# Patient Record
Sex: Female | Born: 1935 | Race: Black or African American | Hispanic: No | Smoking: Never smoker
Health system: Southern US, Community
[De-identification: ages and names within clinical notes are randomized; demographics above are authoritative.]

## PROBLEM LIST (undated history)

## (undated) DIAGNOSIS — I10 Essential (primary) hypertension: Secondary | ICD-10-CM

## (undated) DIAGNOSIS — M1990 Unspecified osteoarthritis, unspecified site: Secondary | ICD-10-CM

## (undated) HISTORY — PX: CHOLECYSTECTOMY: SHX55

## (undated) HISTORY — PX: COLON SURGERY: SHX602

---

## 2009-09-13 ENCOUNTER — Emergency Department (HOSPITAL_BASED_OUTPATIENT_CLINIC_OR_DEPARTMENT_OTHER): Admission: EM | Admit: 2009-09-13 | Discharge: 2009-09-13 | Payer: Self-pay | Admitting: Emergency Medicine

## 2011-09-07 ENCOUNTER — Encounter: Payer: Self-pay | Admitting: Family Medicine

## 2011-09-07 ENCOUNTER — Emergency Department (HOSPITAL_BASED_OUTPATIENT_CLINIC_OR_DEPARTMENT_OTHER)
Admission: EM | Admit: 2011-09-07 | Discharge: 2011-09-07 | Disposition: A | Discharge status: Home / Self Care | Payer: Medicare Other | Attending: Emergency Medicine | Admitting: Emergency Medicine

## 2011-09-07 ENCOUNTER — Emergency Department (INDEPENDENT_AMBULATORY_CARE_PROVIDER_SITE_OTHER): Payer: Medicare Other

## 2011-09-07 DIAGNOSIS — M5137 Other intervertebral disc degeneration, lumbosacral region: Secondary | ICD-10-CM

## 2011-09-07 DIAGNOSIS — M79609 Pain in unspecified limb: Secondary | ICD-10-CM

## 2011-09-07 DIAGNOSIS — I10 Essential (primary) hypertension: Secondary | ICD-10-CM | POA: Insufficient documentation

## 2011-09-07 DIAGNOSIS — M549 Dorsalgia, unspecified: Secondary | ICD-10-CM

## 2011-09-07 DIAGNOSIS — E119 Type 2 diabetes mellitus without complications: Secondary | ICD-10-CM | POA: Insufficient documentation

## 2011-09-07 HISTORY — DX: Essential (primary) hypertension: I10

## 2011-09-07 LAB — URINE MICROSCOPIC-ADD ON

## 2011-09-07 LAB — URINALYSIS, ROUTINE W REFLEX MICROSCOPIC
Hgb urine dipstick: NEGATIVE
Ketones, ur: 15 mg/dL — AB
Nitrite: NEGATIVE
pH: 5 (ref 5.0–8.0)

## 2011-09-07 LAB — GLUCOSE, CAPILLARY: Glucose-Capillary: 126 mg/dL — ABNORMAL HIGH (ref 70–99)

## 2011-09-07 MED ORDER — OXYCODONE-ACETAMINOPHEN 5-325 MG PO TABS: 1.0000 | ORAL_TABLET | Freq: Three times a day (TID) | ORAL | Status: AC | PRN

## 2011-09-07 MED ORDER — OXYCODONE-ACETAMINOPHEN 5-325 MG PO TABS
1.0000 | ORAL_TABLET | Freq: Once | ORAL | Status: AC
  Administered 2011-09-07: 1 via ORAL
  Filled 2011-09-07: qty 1

## 2011-09-07 NOTE — ED Provider Notes (Signed)
History     CSN: [id number] Arrival date & time: 09/07/2011  2:47 PM Pt seen at 1500 Chief Complaint  Patient presents with  . Back Pain   Patient is a 76 y.o. female presenting with back pain. The history is provided by the patient.  Back Pain  This is a new problem. The current episode started more than 2 days ago. The problem occurs daily. The problem has not changed since onset.The pain is associated with no known injury. The pain is present in the lumbar spine. The quality of the pain is described as aching. The pain does not radiate. The pain is moderate. The symptoms are aggravated by certain positions and twisting. Pertinent negatives include no chest pain, no fever, no numbness, no abdominal pain, no bowel incontinence, no bladder incontinence, no dysuria, no leg pain, no paresthesias, no tingling and no weakness.   Denies trauma No fever No abd pain No h/o back surgery No abd pain Not on anticoagulants No cp reported  Past Medical History  Diagnosis Date  . Diabetes mellitus   . Hypertension     Past Surgical History  Procedure Date  . Cholecystectomy   . Colon surgery     No family history on file.  History  Substance Use Topics  . Smoking status: Never Smoker   . Smokeless tobacco: Not on file  . Alcohol Use: No    OB History    Grav Para Term Preterm Abortions TAB SAB Ect Mult Living                  Review of Systems  Constitutional: Negative for fever.  Cardiovascular: Negative for chest pain.  Gastrointestinal: Negative for abdominal pain and bowel incontinence.  Genitourinary: Negative for bladder incontinence and dysuria.  Musculoskeletal: Positive for back pain.  Neurological: Negative for tingling, weakness, numbness and paresthesias.  All other systems reviewed and are negative.    Physical Exam  BP 133/94  Pulse 105  Temp(Src) 98.4 F (36.9 C) (Oral)  Resp 20  Ht 5\' 4"  (1.626 m)  Wt 330 lb (149.687 kg)  BMI 56.64 kg/m2  SpO2  100%  Physical Exam  CONSTITUTIONAL: Well developed/well nourished HEAD AND FACE: Normocephalic/atraumatic EYES: EOMI/PERRL ENMT: Mucous membranes moist NECK: supple no meningeal signs SPINE:lumbar spine tender, lumbar paraspinal tenderness, No bruising/crepitance/stepoffs noted to spine CV: no murmurs/rubs/gallops noted LUNGS: Lungs are clear to auscultation bilaterally, no apparent distress ABDOMEN: soft, nontender, no rebound or guarding.  Obesity noted but no focal tenderness/abscess/erythema noted GU:no cva tenderness, no bruising noted NEURO: Pt is awake/alert, moves all extremitiesx4 She is able to ambulate equal distal motor: hip flexion/knee flexion/extension, ankle dorsi/plantar flexion, great toe extension intact bilaterally,no apparent sensory deficit in any dermatome.   EXTREMITIES: pulses normal/equal, full ROM SKIN: warm, color normal PSYCH: no abnormalities of mood noted   ED Course  Procedures  MDM Nursing notes reviewed and considered in documentation All labs/vitals reviewed and considered xrays reviewed and considered  3:19 PM Pt reports pain only with movement/palpation of back She has no neuro deficits She is well appearing She uses a walker all the time even before back pain started, and reports no change in ambulation   Pt well appearing, feels improved tolerated percocet No focal motor deficits She is ambulatory at baseline Urine culture sent I advised strict return precautions and need for close f/u with her PCP Dr orr Pt agreeable   Joya Gaskins, MD 09/07/11 1642

## 2011-09-07 NOTE — ED Notes (Signed)
Pt c/o bilateral low back pain x 1 wk. Pt reports h/o back pain but "worse today". Pt denies bowel or bladder incontinence. Pain is worse with movement.

## 2011-09-09 LAB — URINE CULTURE
Colony Count: 40000
Culture  Setup Time: 201209130615

## 2012-05-03 ENCOUNTER — Emergency Department (INDEPENDENT_AMBULATORY_CARE_PROVIDER_SITE_OTHER): Payer: Medicare Other

## 2012-05-03 ENCOUNTER — Emergency Department (HOSPITAL_BASED_OUTPATIENT_CLINIC_OR_DEPARTMENT_OTHER)
Admission: EM | Admit: 2012-05-03 | Discharge: 2012-05-03 | Disposition: A | Discharge status: Home / Self Care | Payer: Medicare Other | Attending: Emergency Medicine | Admitting: Emergency Medicine

## 2012-05-03 ENCOUNTER — Encounter (HOSPITAL_BASED_OUTPATIENT_CLINIC_OR_DEPARTMENT_OTHER): Payer: Self-pay | Admitting: *Deleted

## 2012-05-03 DIAGNOSIS — M25559 Pain in unspecified hip: Secondary | ICD-10-CM | POA: Insufficient documentation

## 2012-05-03 DIAGNOSIS — I10 Essential (primary) hypertension: Secondary | ICD-10-CM | POA: Insufficient documentation

## 2012-05-03 DIAGNOSIS — E119 Type 2 diabetes mellitus without complications: Secondary | ICD-10-CM | POA: Insufficient documentation

## 2012-05-03 DIAGNOSIS — M129 Arthropathy, unspecified: Secondary | ICD-10-CM | POA: Insufficient documentation

## 2012-05-03 DIAGNOSIS — Z794 Long term (current) use of insulin: Secondary | ICD-10-CM | POA: Insufficient documentation

## 2012-05-03 DIAGNOSIS — M25551 Pain in right hip: Secondary | ICD-10-CM

## 2012-05-03 HISTORY — DX: Unspecified osteoarthritis, unspecified site: M19.90

## 2012-05-03 MED ORDER — TRAMADOL HCL 50 MG PO TABS: 50.0000 mg | ORAL_TABLET | Freq: Four times a day (QID) | ORAL | Status: AC | PRN

## 2012-05-03 MED ORDER — TRAMADOL HCL 50 MG PO TABS
50.0000 mg | ORAL_TABLET | Freq: Once | ORAL | Status: AC
  Administered 2012-05-03: 50 mg via ORAL
  Filled 2012-05-03: qty 1

## 2012-05-03 NOTE — ED Provider Notes (Signed)
History     CSN: [id number]  Arrival date & time 05/03/12  1145   First MD Initiated Contact with Patient 05/03/12 1152      Chief Complaint  Patient presents with  . Hip Pain    (Consider location/radiation/quality/duration/timing/severity/associated sxs/prior treatment) HPI Patient is a 77 year old female with history of osteoarthritis who complains today of right hip pain. She rates this as an 8/10. This is worse with walking and radiates to her right knee and back. Patient denies any pain in the knee itself. She has no history of trauma. Patient has noted this pain for the past 2 days. It is an aching sensation. She has never had anything like it. Patient has previously taken Arthrotec for her symptoms. She notes that her prescription is not yet ready for this. Patient denies any other symptoms today. There are no other associated or modifying factors. Past Medical History  Diagnosis Date  . Diabetes mellitus   . Hypertension   . Arthritis     Past Surgical History  Procedure Date  . Cholecystectomy   . Colon surgery     History reviewed. No pertinent family history.  History  Substance Use Topics  . Smoking status: Never Smoker   . Smokeless tobacco: Not on file  . Alcohol Use: No    OB History    Grav Para Term Preterm Abortions TAB SAB Ect Mult Living                  Review of Systems  Constitutional: Negative.   HENT: Negative.   Eyes: Negative.   Respiratory: Negative.   Cardiovascular: Negative.   Gastrointestinal: Negative.   Genitourinary: Negative.   Musculoskeletal:       See HPI  Skin: Negative.   Neurological: Negative.   Hematological: Negative.   Psychiatric/Behavioral: Negative.   All other systems reviewed and are negative.    Allergies  Ciprofloxacin and Vicodin  Home Medications   Current Outpatient Rx  Name Route Sig Dispense Refill  . ALBUTEROL SULFATE 0.63 MG/3ML IN NEBU Nebulization Take 1 ampule by nebulization every 6  (six) hours as needed.      . ARFORMOTEROL TARTRATE 15 MCG/2ML IN NEBU Nebulization Take 15 mcg by nebulization 2 (two) times daily.      . ASPIRIN 325 MG PO TBEC Oral Take 325 mg by mouth daily.      . ATORVASTATIN CALCIUM 40 MG PO TABS Oral Take 40 mg by mouth daily.      . BUMETANIDE 0.5 MG PO TABS Oral Take 0.5 mg by mouth daily.      Marland Kitchen DICLOFENAC-MISOPROSTOL 75-200 MG-MCG PO TABS Oral Take 1 tablet by mouth daily.      Marland Kitchen GABAPENTIN 300 MG PO CAPS Oral Take 300 mg by mouth 3 (three) times daily.      . INSULIN ASPART PROT & ASPART (70-30) 100 UNIT/ML [place] SUSP Subcutaneous Inject into the skin.      . INSULIN PEN NEEDLE 30G X 8 MM MISC Subcutaneous Inject 1 packet into the skin as needed.      Marland Kitchen METFORMIN HCL 500 MG PO TABS Oral Take 500 mg by mouth daily after supper.      Marland Kitchen NITROGLYCERIN 0.4 MG SL SUBL Sublingual Place 0.4 mg under the tongue every 5 (five) minutes as needed.      Marland Kitchen NORTRIPTYLINE HCL 25 MG PO CAPS Oral Take 25 mg by mouth at bedtime.      . OMEPRAZOLE 20 MG  PO CPDR Oral Take 20 mg by mouth daily.      . TRAMADOL HCL 50 MG PO TABS Oral Take 1 tablet (50 mg total) by mouth every 6 (six) hours as needed for pain. 30 tablet 0  . VALSARTAN-HYDROCHLOROTHIAZIDE 160-25 MG PO TABS Oral Take 1 tablet by mouth daily.        BP 153/76  Pulse 77  Temp(Src) 98.2 F (36.8 C) (Oral)  Resp 20  SpO2 100%  Physical Exam  Nursing note and vitals reviewed. GEN: Well-developed, morbidly obese female in no distress HEENT: Atraumatic, normocephalic.  EYES: PERRLA BL, no scleral icterus. NECK: Trachea midline, no meningismus CV: regular rate and rhythm.  PULM: No respiratory distress.  Breathing easily  GI: soft, non-tender. No guarding, rebound, or tenderness. + bowel sounds  GU: deferred Neuro: cranial nerves 2-12 intact, no abnormalities of strength or sensation, A and O x 3 MSK: Patient moves all 4 extremities symmetrically, no deformity, edema noted. Patient has no tenderness to  palpation in the right hip. She describes the pain as an internal aching. This is worse with standing and bearing weight on the hip. Patient's body habitus severely limits evaluation. She is very obese and has limited range of motion just based on body habitus at baseline. She has been ambulate with her walker. Skin: No rashes petechiae, purpura, or jaundice Psych: no abnormality of mood   ED Course  Procedures (including critical care time)  Labs Reviewed - No data to display Dg Hip Complete Right  05/03/2012  *RADIOLOGY REPORT*  Clinical Data: Right hip pain.  RIGHT HIP - COMPLETE 2+ VIEW  Comparison: None  Findings: Mild degenerative changes in the hips bilaterally. No acute bony abnormality.  Specifically, no fracture, subluxation, or dislocation.  Soft tissues are intact.  SI joints are symmetric and unremarkable.  IMPRESSION: Mild degenerative changes. No acute bony abnormality.  Original Report Authenticated By: Cyndie Chime, M.D.     1. Hip pain, acute, right       MDM  Patient presented with complaint of right hip pain. She had continued to ambulate with this. She did have plain film performed to confirm no occult cause of pain such as a sclerotic lesion or evidence of other process within the body. This was negative. Patient had some improvement in her pain was tramadol here. She was given a prescription for this until her Arthrotec can be refilled. Patient was discharged in good condition and was comfortable with plan for discharge.        Cyndra Numbers, MD 05/03/12 1339

## 2012-05-03 NOTE — ED Notes (Signed)
Right hip pain for a week. Got worse yesterday. No relief with rubbing it. States she has a hx of arthritis in her knees and back.

## 2014-07-20 ENCOUNTER — Encounter (HOSPITAL_BASED_OUTPATIENT_CLINIC_OR_DEPARTMENT_OTHER): Payer: Self-pay | Admitting: Emergency Medicine

## 2014-07-20 ENCOUNTER — Emergency Department (HOSPITAL_BASED_OUTPATIENT_CLINIC_OR_DEPARTMENT_OTHER)
Admission: EM | Admit: 2014-07-20 | Discharge: 2014-07-20 | Disposition: A | Discharge status: Home / Self Care | Payer: Medicare Other | Attending: Emergency Medicine | Admitting: Emergency Medicine

## 2014-07-20 ENCOUNTER — Emergency Department (HOSPITAL_BASED_OUTPATIENT_CLINIC_OR_DEPARTMENT_OTHER): Payer: Medicare Other

## 2014-07-20 DIAGNOSIS — M1710 Unilateral primary osteoarthritis, unspecified knee: Secondary | ICD-10-CM | POA: Diagnosis not present

## 2014-07-20 DIAGNOSIS — Y9389 Activity, other specified: Secondary | ICD-10-CM | POA: Diagnosis not present

## 2014-07-20 DIAGNOSIS — IMO0002 Reserved for concepts with insufficient information to code with codable children: Secondary | ICD-10-CM | POA: Diagnosis not present

## 2014-07-20 DIAGNOSIS — S4980XA Other specified injuries of shoulder and upper arm, unspecified arm, initial encounter: Secondary | ICD-10-CM | POA: Insufficient documentation

## 2014-07-20 DIAGNOSIS — E119 Type 2 diabetes mellitus without complications: Secondary | ICD-10-CM | POA: Insufficient documentation

## 2014-07-20 DIAGNOSIS — Z791 Long term (current) use of non-steroidal anti-inflammatories (NSAID): Secondary | ICD-10-CM | POA: Diagnosis not present

## 2014-07-20 DIAGNOSIS — S46909A Unspecified injury of unspecified muscle, fascia and tendon at shoulder and upper arm level, unspecified arm, initial encounter: Secondary | ICD-10-CM | POA: Insufficient documentation

## 2014-07-20 DIAGNOSIS — I10 Essential (primary) hypertension: Secondary | ICD-10-CM | POA: Diagnosis not present

## 2014-07-20 DIAGNOSIS — S59909A Unspecified injury of unspecified elbow, initial encounter: Secondary | ICD-10-CM | POA: Insufficient documentation

## 2014-07-20 DIAGNOSIS — Z79899 Other long term (current) drug therapy: Secondary | ICD-10-CM | POA: Insufficient documentation

## 2014-07-20 DIAGNOSIS — Y9289 Other specified places as the place of occurrence of the external cause: Secondary | ICD-10-CM | POA: Insufficient documentation

## 2014-07-20 DIAGNOSIS — W19XXXA Unspecified fall, initial encounter: Secondary | ICD-10-CM

## 2014-07-20 DIAGNOSIS — S99919A Unspecified injury of unspecified ankle, initial encounter: Secondary | ICD-10-CM

## 2014-07-20 DIAGNOSIS — S99929A Unspecified injury of unspecified foot, initial encounter: Secondary | ICD-10-CM | POA: Diagnosis not present

## 2014-07-20 DIAGNOSIS — W010XXA Fall on same level from slipping, tripping and stumbling without subsequent striking against object, initial encounter: Secondary | ICD-10-CM | POA: Insufficient documentation

## 2014-07-20 DIAGNOSIS — M1712 Unilateral primary osteoarthritis, left knee: Secondary | ICD-10-CM

## 2014-07-20 DIAGNOSIS — S59919A Unspecified injury of unspecified forearm, initial encounter: Secondary | ICD-10-CM

## 2014-07-20 DIAGNOSIS — S6990XA Unspecified injury of unspecified wrist, hand and finger(s), initial encounter: Secondary | ICD-10-CM

## 2014-07-20 DIAGNOSIS — S8990XA Unspecified injury of unspecified lower leg, initial encounter: Secondary | ICD-10-CM | POA: Insufficient documentation

## 2014-07-20 DIAGNOSIS — Z7982 Long term (current) use of aspirin: Secondary | ICD-10-CM | POA: Insufficient documentation

## 2014-07-20 MED ORDER — IBUPROFEN 200 MG PO TABS: 200.0000 mg | ORAL_TABLET | Freq: Four times a day (QID) | ORAL | Status: AC | PRN

## 2014-07-20 NOTE — ED Notes (Signed)
Pt sts she was moving from her walker to her chair and slipped and fell. Pt c/o left arm and leg pain.

## 2014-07-20 NOTE — ED Notes (Signed)
Return from X-ray. Pending for results. Pt reports no LOC. C/o pain on left shoulder, left knee and left elbow.

## 2014-07-20 NOTE — ED Provider Notes (Signed)
CSN: [id number]     Arrival date & time 07/20/14  1235 History   First MD Initiated Contact with Patient 07/20/14 1257     Chief Complaint  Patient presents with  . Fall   HPI The patient presents to the emergency room for evaluation of left arm and left knee injuries after a fall. The patient uses a walker. She was moving from her walker to a chair when her left leg slipped and she fell to the ground. Patient fell onto her left side. She did not have any loss of consciousness. She has not had any trouble with weakness or dizziness. Patient now has some mild to moderate pain and discomfort in her left shoulder, left elbow and left knee.   Past Medical History  Diagnosis Date  . Diabetes mellitus   . Hypertension   . Arthritis    Past Surgical History  Procedure Laterality Date  . Cholecystectomy    . Colon surgery     No family history on file. History  Substance Use Topics  . Smoking status: Never Smoker   . Smokeless tobacco: Not on file  . Alcohol Use: No   OB History   Grav Para Term Preterm Abortions TAB SAB Ect Mult Living                 Review of Systems  All other systems reviewed and are negative.     Allergies  Ciprofloxacin and Vicodin  Home Medications   Prior to Admission medications   Medication Sig Start Date End Date Taking? Authorizing Provider  simvastatin (ZOCOR) 40 MG tablet Take 40 mg by mouth daily.   Yes Historical Provider, MD  albuterol (ACCUNEB) 0.63 MG/3ML nebulizer solution Take 1 ampule by nebulization every 6 (six) hours as needed.      Historical Provider, MD  arformoterol (BROVANA) 15 MCG/2ML NEBU Take 15 mcg by nebulization 2 (two) times daily.      Historical Provider, MD  aspirin 325 MG EC tablet Take 325 mg by mouth daily.      Historical Provider, MD  atorvastatin (LIPITOR) 40 MG tablet Take 40 mg by mouth daily.      Historical Provider, MD  bumetanide (BUMEX) 0.5 MG tablet Take 0.5 mg by mouth daily.      Historical Provider,  MD  diclofenac-misoprostol (ARTHROTEC 75) 75-0.2 MG per tablet Take 1 tablet by mouth daily.      Historical Provider, MD  gabapentin (NEURONTIN) 300 MG capsule Take 300 mg by mouth 3 (three) times daily.      Historical Provider, MD  insulin aspart protamine-insulin aspart (NOVOLOG 70/30) (70-30) 100 UNIT/ML injection Inject into the skin.      Historical Provider, MD  Insulin Pen Needle (NOVOFINE) 30G X 8 MM MISC Inject 1 packet into the skin as needed.      Historical Provider, MD  metFORMIN (GLUCOPHAGE) 500 MG tablet Take 500 mg by mouth daily after supper.      Historical Provider, MD  nitroGLYCERIN (NITROSTAT) 0.4 MG SL tablet Place 0.4 mg under the tongue every 5 (five) minutes as needed.      Historical Provider, MD  nortriptyline (PAMELOR) 25 MG capsule Take 25 mg by mouth at bedtime.      Historical Provider, MD  omeprazole (PRILOSEC) 20 MG capsule Take 20 mg by mouth daily.      Historical Provider, MD  valsartan-hydrochlorothiazide (DIOVAN-HCT) 160-25 MG per tablet Take 1 tablet by mouth daily.  Historical Provider, MD   BP 190/85  Pulse 110  Temp(Src) 98.2 F (36.8 C) (Oral)  Resp 20  Ht 5\' 4"  (1.626 m)  Wt 330 lb (149.687 kg)  BMI 56.62 kg/m2  SpO2 98% Physical Exam  Nursing note and vitals reviewed. Constitutional: No distress.  Morbidly obese  HENT:  Head: Normocephalic and atraumatic.  Right Ear: External ear normal.  Left Ear: External ear normal.  Eyes: Conjunctivae are normal. Right eye exhibits no discharge. Left eye exhibits no discharge. No scleral icterus.  Neck: Neck supple. No tracheal deviation present.  Cardiovascular: Normal rate, regular rhythm and intact distal pulses.   Pulmonary/Chest: Effort normal and breath sounds normal. No stridor. No respiratory distress. She has no wheezes. She has no rales.  Abdominal: Soft. Bowel sounds are normal. She exhibits no distension. There is no tenderness. There is no rebound and no guarding.  Musculoskeletal:  She exhibits no edema.       Left shoulder: She exhibits tenderness and bony tenderness. She exhibits normal range of motion, no swelling, no effusion and no deformity.       Left elbow: She exhibits normal range of motion, no swelling, no effusion and no deformity. Tenderness found.       Left hip: Normal. She exhibits normal range of motion, normal strength and no tenderness.       Left knee: She exhibits normal range of motion, no swelling and no effusion. Tenderness found.       Cervical back: Normal.       Thoracic back: Normal.       Lumbar back: Normal.  Mild tenderness palpation left shoulder and left olecranon process, superficial abrasion noted over the left olecranon  Neurological: She is alert. She has normal strength. No cranial nerve deficit (no facial droop, extraocular movements intact, no slurred speech) or sensory deficit. She exhibits normal muscle tone. She displays no seizure activity. Coordination normal.  Skin: Skin is warm and dry. No rash noted.  Psychiatric: She has a normal mood and affect.    ED Course  Procedures (including critical care time) Labs Review Labs Reviewed - No data to display  Imaging Review Dg Elbow Complete Left  07/20/2014   CLINICAL DATA:  Left elbow injury and pain  EXAM: LEFT ELBOW - COMPLETE 3+ VIEW  COMPARISON:  None.  FINDINGS: There is no evidence of acute fracture, subluxation or dislocation.  There is no evidence of joint effusion.  Degenerative changes within the elbow noted.  No focal bony lesions are present.  IMPRESSION: No evidence of acute bony abnormality.   Electronically Signed   By: Laveda AbbeJeff  Hu M.D.   On: 07/20/2014 14:03   Dg Shoulder Left  07/20/2014   CLINICAL DATA:  Left shoulder injury and pain.  EXAM: LEFT SHOULDER - 2+ VIEW  COMPARISON:  None.  FINDINGS: A high riding humeral head is noted.  Degenerative changes within the shoulder present.  There is no evidence of acute fracture or dislocation.  No focal bony lesions are  present.  IMPRESSION: No evidence of acute bony abnormality.  Degenerative changes and high riding humeral head compatible with rotator cuff tear, likely chronic.   Electronically Signed   By: Laveda AbbeJeff  Hu M.D.   On: 07/20/2014 14:05   Dg Knee Complete 4 Views Left  07/20/2014   CLINICAL DATA:  Fall, knee pain, difficulty ambulating, obesity  EXAM: LEFT KNEE - COMPLETE 4+ VIEW  COMPARISON:  None.  FINDINGS: Advanced severe tricompartmental osteoarthritis with  extensive joint space loss, sclerosis and osteophyte formation. Normal alignment. No acute displaced fracture or large effusion. Lateral view is limited. The bones are osteopenic.  IMPRESSION: Advanced tricompartmental left knee osteoarthritis. No definite acute finding by plain radiography.   Electronically Signed   By: Ruel Favors M.D.   On: 07/20/2014 14:06      MDM   Final diagnoses:  Fall, initial encounter  Osteoarthritis of left knee, unspecified osteoarthritis type   Mechanical fall.  No serious injury.  Consistent with soft tissue sprain/strain   Linwood Dibbles, MD 07/20/14 [phone]

## 2014-08-16 ENCOUNTER — Emergency Department (HOSPITAL_BASED_OUTPATIENT_CLINIC_OR_DEPARTMENT_OTHER)
Admission: EM | Admit: 2014-08-16 | Discharge: 2014-08-16 | Disposition: A | Discharge status: Home / Self Care | Payer: Medicare Other | Attending: Emergency Medicine | Admitting: Emergency Medicine

## 2014-08-16 ENCOUNTER — Encounter (HOSPITAL_BASED_OUTPATIENT_CLINIC_OR_DEPARTMENT_OTHER): Payer: Self-pay | Admitting: Emergency Medicine

## 2014-08-16 ENCOUNTER — Emergency Department (HOSPITAL_BASED_OUTPATIENT_CLINIC_OR_DEPARTMENT_OTHER): Payer: Medicare Other

## 2014-08-16 DIAGNOSIS — M129 Arthropathy, unspecified: Secondary | ICD-10-CM | POA: Diagnosis not present

## 2014-08-16 DIAGNOSIS — M79671 Pain in right foot: Secondary | ICD-10-CM

## 2014-08-16 DIAGNOSIS — Z79899 Other long term (current) drug therapy: Secondary | ICD-10-CM | POA: Insufficient documentation

## 2014-08-16 DIAGNOSIS — I10 Essential (primary) hypertension: Secondary | ICD-10-CM | POA: Diagnosis not present

## 2014-08-16 DIAGNOSIS — Z794 Long term (current) use of insulin: Secondary | ICD-10-CM | POA: Insufficient documentation

## 2014-08-16 DIAGNOSIS — M7989 Other specified soft tissue disorders: Secondary | ICD-10-CM | POA: Insufficient documentation

## 2014-08-16 DIAGNOSIS — M79609 Pain in unspecified limb: Secondary | ICD-10-CM | POA: Insufficient documentation

## 2014-08-16 DIAGNOSIS — Z7982 Long term (current) use of aspirin: Secondary | ICD-10-CM | POA: Insufficient documentation

## 2014-08-16 DIAGNOSIS — E119 Type 2 diabetes mellitus without complications: Secondary | ICD-10-CM | POA: Insufficient documentation

## 2014-08-16 LAB — CBC WITH DIFFERENTIAL/PLATELET
Basophils Absolute: 0 10*3/uL (ref 0.0–0.1)
Basophils Relative: 0 % (ref 0–1)
Eosinophils Absolute: 0.1 10*3/uL (ref 0.0–0.7)
Eosinophils Relative: 1 % (ref 0–5)
HCT: 37.1 % (ref 36.0–46.0)
Hemoglobin: 11.3 g/dL — ABNORMAL LOW (ref 12.0–15.0)
LYMPHS ABS: 1.7 10*3/uL (ref 0.7–4.0)
LYMPHS PCT: 18 % (ref 12–46)
MCH: 25.7 pg — ABNORMAL LOW (ref 26.0–34.0)
MCHC: 30.5 g/dL (ref 30.0–36.0)
MCV: 84.3 fL (ref 78.0–100.0)
Monocytes Absolute: 0.8 10*3/uL (ref 0.1–1.0)
Monocytes Relative: 8 % (ref 3–12)
NEUTROS PCT: 73 % (ref 43–77)
Neutro Abs: 7 10*3/uL (ref 1.7–7.7)
PLATELETS: 204 10*3/uL (ref 150–400)
RBC: 4.4 MIL/uL (ref 3.87–5.11)
RDW: 15 % (ref 11.5–15.5)
WBC: 9.6 10*3/uL (ref 4.0–10.5)

## 2014-08-16 LAB — BASIC METABOLIC PANEL
Anion gap: 15 (ref 5–15)
BUN: 14 mg/dL (ref 6–23)
CHLORIDE: 98 meq/L (ref 96–112)
CO2: 27 meq/L (ref 19–32)
Calcium: 9.6 mg/dL (ref 8.4–10.5)
Creatinine, Ser: 0.9 mg/dL (ref 0.50–1.10)
GFR calc Af Amer: 70 mL/min — ABNORMAL LOW (ref >90)
GFR, EST NON AFRICAN AMERICAN: 61 mL/min — AB (ref >90)
GLUCOSE: 206 mg/dL — AB (ref 70–99)
POTASSIUM: 4.3 meq/L (ref 3.7–5.3)
SODIUM: 140 meq/L (ref 137–147)

## 2014-08-16 MED ORDER — OXYCODONE-ACETAMINOPHEN 5-325 MG PO TABS
1.0000 | ORAL_TABLET | Freq: Once | ORAL | Status: AC
  Administered 2014-08-16: 1 via ORAL
  Filled 2014-08-16: qty 1

## 2014-08-16 MED ORDER — OXYCODONE-ACETAMINOPHEN 5-325 MG PO TABS: 1.0000 | ORAL_TABLET | ORAL | Status: AC | PRN

## 2014-08-16 NOTE — ED Provider Notes (Signed)
Medical screening examination/treatment/procedure(s) were conducted as a shared visit with non-physician practitioner(s) and myself.  I personally evaluated the patient during the encounter.   EKG Interpretation None       Patient with atraumatic right lateral foot pain. No erythema or induration. No elevated WBC. Likely an arthritis process. D/C with pain control, f/u with PCP.  Audree Camel, MD 08/16/14 1520

## 2014-08-16 NOTE — Discharge Instructions (Signed)
Elevate the foot and rest for the next 2-3 days. If pain persists, follow up with your doctor for recheck and any further outpatient evaluation and management of foot pain.

## 2014-08-16 NOTE — ED Provider Notes (Signed)
CSN: [id number]     Arrival date & time 08/16/14  1136 History   First MD Initiated Contact with Patient 08/16/14 1159     Chief Complaint  Patient presents with  . Leg Swelling     (Consider location/radiation/quality/duration/timing/severity/associated sxs/prior Treatment) Patient is a 79 y.o. female presenting with lower extremity pain. The history is provided by the patient. No language interpreter was used.  Foot Pain Pertinent negatives include no chest pain, chills, fever, nausea, vomiting or weakness. Associated symptoms comments: Right foot pain for the past 2-3 days without known injury. No fever. She has noticed increased swelling to the foot and reports the greatest pain is over the base of the 5th MT. .    Past Medical History  Diagnosis Date  . Diabetes mellitus   . Hypertension   . Arthritis    Past Surgical History  Procedure Laterality Date  . Cholecystectomy    . Colon surgery     No family history on file. History  Substance Use Topics  . Smoking status: Never Smoker   . Smokeless tobacco: Not on file  . Alcohol Use: No   OB History   Grav Para Term Preterm Abortions TAB SAB Ect Mult Living                 Review of Systems  Constitutional: Negative for fever and chills.  Respiratory: Negative.  Negative for shortness of breath.   Cardiovascular: Negative.  Negative for chest pain.  Gastrointestinal: Negative.  Negative for nausea and vomiting.  Musculoskeletal:       See HPI.  Skin: Negative.  Negative for wound.  Neurological: Negative.  Negative for weakness.      Allergies  Ciprofloxacin and Vicodin  Home Medications   Prior to Admission medications   Medication Sig Start Date End Date Taking? Authorizing Provider  albuterol (ACCUNEB) 0.63 MG/3ML nebulizer solution Take 1 ampule by nebulization every 6 (six) hours as needed.      Historical Provider, MD  arformoterol (BROVANA) 15 MCG/2ML NEBU Take 15 mcg by nebulization 2 (two) times  daily.      Historical Provider, MD  aspirin 325 MG EC tablet Take 325 mg by mouth daily.      Historical Provider, MD  atorvastatin (LIPITOR) 40 MG tablet Take 40 mg by mouth daily.      Historical Provider, MD  bumetanide (BUMEX) 0.5 MG tablet Take 0.5 mg by mouth daily.      Historical Provider, MD  diclofenac-misoprostol (ARTHROTEC 75) 75-0.2 MG per tablet Take 1 tablet by mouth daily.      Historical Provider, MD  gabapentin (NEURONTIN) 300 MG capsule Take 300 mg by mouth 3 (three) times daily.      Historical Provider, MD  ibuprofen (ADVIL,MOTRIN) 200 MG tablet Take 1 tablet (200 mg total) by mouth every 6 (six) hours as needed. 07/20/14   Linwood Dibbles, MD  insulin aspart protamine-insulin aspart (NOVOLOG 70/30) (70-30) 100 UNIT/ML injection Inject into the skin.      Historical Provider, MD  Insulin Pen Needle (NOVOFINE) 30G X 8 MM MISC Inject 1 packet into the skin as needed.      Historical Provider, MD  metFORMIN (GLUCOPHAGE) 500 MG tablet Take 500 mg by mouth daily after supper.      Historical Provider, MD  nitroGLYCERIN (NITROSTAT) 0.4 MG SL tablet Place 0.4 mg under the tongue every 5 (five) minutes as needed.      Historical Provider, MD  nortriptyline [facility])  25 MG capsule Take 25 mg by mouth at bedtime.      Historical Provider, MD  omeprazole (PRILOSEC) 20 MG capsule Take 20 mg by mouth daily.      Historical Provider, MD  simvastatin (ZOCOR) 40 MG tablet Take 40 mg by mouth daily.    Historical Provider, MD  valsartan-hydrochlorothiazide (DIOVAN-HCT) 160-25 MG per tablet Take 1 tablet by mouth daily.      Historical Provider, MD   BP 141/72  Pulse 98  Temp(Src) 98.4 F (36.9 C) (Oral)  Resp 18  Wt 330 lb (149.687 kg)  SpO2 97% Physical Exam  Constitutional: She is oriented to person, place, and time. She appears well-developed and well-nourished.  Neck: Normal range of motion.  Pulmonary/Chest: Effort normal.  Musculoskeletal:  Right foot mildly swollen but not more than  left foot. Slight warmth to light touch when compared to right. No bony deformities. Tender to dorsum and lateral forefoot.  Neurological: She is alert and oriented to person, place, and time.  Skin: Skin is warm and dry.  No lesions, ulcerations or discoloration of right foot.     ED Course  Procedures (including critical care time) Labs Review Labs Reviewed  CBC WITH DIFFERENTIAL - Abnormal; Notable for the following:    Hemoglobin 11.3 (*)    MCH 25.7 (*)    All other components within normal limits  BASIC METABOLIC PANEL   Results for orders placed during the hospital encounter of 08/16/14  CBC WITH DIFFERENTIAL      Result Value Ref Range   WBC 9.6  4.0 - 10.5 K/uL   RBC 4.40  3.87 - 5.11 MIL/uL   Hemoglobin 11.3 (*) 12.0 - 15.0 g/dL   HCT 91.4  78.2 - 95.6 %   MCV 84.3  78.0 - 100.0 fL   MCH 25.7 (*) 26.0 - 34.0 pg   MCHC 30.5  30.0 - 36.0 g/dL   RDW 21.3  08.6 - 57.8 %   Platelets 204  150 - 400 K/uL   Neutrophils Relative % 73  43 - 77 %   Neutro Abs 7.0  1.7 - 7.7 K/uL   Lymphocytes Relative 18  12 - 46 %   Lymphs Abs 1.7  0.7 - 4.0 K/uL   Monocytes Relative 8  3 - 12 %   Monocytes Absolute 0.8  0.1 - 1.0 K/uL   Eosinophils Relative 1  0 - 5 %   Eosinophils Absolute 0.1  0.0 - 0.7 K/uL   Basophils Relative 0  0 - 1 %   Basophils Absolute 0.0  0.0 - 0.1 K/uL  BASIC METABOLIC PANEL      Result Value Ref Range   Sodium 140  137 - 147 mEq/L   Potassium 4.3  3.7 - 5.3 mEq/L   Chloride 98  96 - 112 mEq/L   CO2 27  19 - 32 mEq/L   Glucose, Bld 206 (*) 70 - 99 mg/dL   BUN 14  6 - 23 mg/dL   Creatinine, Ser 4.69  0.50 - 1.10 mg/dL   Calcium 9.6  8.4 - 62.9 mg/dL   GFR calc non Af Amer 61 (*) >90 mL/min   GFR calc Af Amer 70 (*) >90 mL/min   Anion gap 15  5 - 15   Dg Foot Complete Right  08/16/2014   CLINICAL DATA:  Leg swelling  EXAM: RIGHT FOOT COMPLETE - 3+ VIEW  COMPARISON:  None.  FINDINGS: Three views of the right foot  submitted. No acute fracture or  subluxation. There is soft tissue swelling dorsal metatarsal region. Plantar spur of calcaneus. Mild dorsal spurring tarsal region.  IMPRESSION: No acute fracture or subluxation. Soft tissue swelling metatarsal region.   Electronically Signed   By: Natasha MeadLiviu  Pop M.D.   On: 08/16/2014 13:12   Imaging Review No results found.   EKG Interpretation None      MDM   Final diagnoses:  None    1. Right foot pain  No evidence to support infectious process. No skin breakdown or ulceration in diabetic foot. No significant erythema, fever. And no evidence acute fracture. Will manage pain and refer for follow up with PCP this week.     Arnoldo HookerShari A Jedaiah Rathbun, PA-C 08/16/14 1351

## 2014-08-16 NOTE — ED Notes (Addendum)
Patient here with 3 days of increased ankle and feet swelling. Reports that she normally has no problem with edema. No shortness of breath, no chest pain. After further discussion patient reports that she does take a fluid pill once every 2 weeks.

## 2016-05-05 ENCOUNTER — Encounter (HOSPITAL_BASED_OUTPATIENT_CLINIC_OR_DEPARTMENT_OTHER): Payer: Self-pay | Admitting: *Deleted

## 2016-05-05 ENCOUNTER — Emergency Department (HOSPITAL_BASED_OUTPATIENT_CLINIC_OR_DEPARTMENT_OTHER)
Admission: EM | Admit: 2016-05-05 | Discharge: 2016-05-05 | Disposition: A | Discharge status: Home / Self Care | Payer: Medicare Other | Attending: Emergency Medicine | Admitting: Emergency Medicine

## 2016-05-05 ENCOUNTER — Emergency Department (HOSPITAL_BASED_OUTPATIENT_CLINIC_OR_DEPARTMENT_OTHER): Payer: Medicare Other

## 2016-05-05 DIAGNOSIS — Z7984 Long term (current) use of oral hypoglycemic drugs: Secondary | ICD-10-CM | POA: Diagnosis not present

## 2016-05-05 DIAGNOSIS — I10 Essential (primary) hypertension: Secondary | ICD-10-CM | POA: Diagnosis not present

## 2016-05-05 DIAGNOSIS — M7989 Other specified soft tissue disorders: Secondary | ICD-10-CM | POA: Diagnosis present

## 2016-05-05 DIAGNOSIS — L03115 Cellulitis of right lower limb: Secondary | ICD-10-CM | POA: Diagnosis not present

## 2016-05-05 DIAGNOSIS — E119 Type 2 diabetes mellitus without complications: Secondary | ICD-10-CM | POA: Insufficient documentation

## 2016-05-05 DIAGNOSIS — Z79899 Other long term (current) drug therapy: Secondary | ICD-10-CM | POA: Insufficient documentation

## 2016-05-05 LAB — CBC WITH DIFFERENTIAL/PLATELET
Basophils Absolute: 0 10*3/uL (ref 0.0–0.1)
Basophils Relative: 0 %
EOS ABS: 0.1 10*3/uL (ref 0.0–0.7)
Eosinophils Relative: 1 %
HEMATOCRIT: 36.7 % (ref 36.0–46.0)
HEMOGLOBIN: 11.6 g/dL — AB (ref 12.0–15.0)
LYMPHS ABS: 2.5 10*3/uL (ref 0.7–4.0)
LYMPHS PCT: 33 %
MCH: 26.1 pg (ref 26.0–34.0)
MCHC: 31.6 g/dL (ref 30.0–36.0)
MCV: 82.7 fL (ref 78.0–100.0)
MONOS PCT: 9 %
Monocytes Absolute: 0.7 10*3/uL (ref 0.1–1.0)
NEUTROS ABS: 4.5 10*3/uL (ref 1.7–7.7)
NEUTROS PCT: 57 %
Platelets: 185 10*3/uL (ref 150–400)
RBC: 4.44 MIL/uL (ref 3.87–5.11)
RDW: 15.4 % (ref 11.5–15.5)
WBC: 7.8 10*3/uL (ref 4.0–10.5)

## 2016-05-05 LAB — BASIC METABOLIC PANEL
Anion gap: 7 (ref 5–15)
BUN: 19 mg/dL (ref 6–20)
CHLORIDE: 103 mmol/L (ref 101–111)
CO2: 26 mmol/L (ref 22–32)
CREATININE: 1.35 mg/dL — AB (ref 0.44–1.00)
Calcium: 8.5 mg/dL — ABNORMAL LOW (ref 8.9–10.3)
GFR calc non Af Amer: 37 mL/min — ABNORMAL LOW (ref >60)
GFR, EST AFRICAN AMERICAN: 42 mL/min — AB (ref >60)
Glucose, Bld: 176 mg/dL — ABNORMAL HIGH (ref 65–99)
POTASSIUM: 3.9 mmol/L (ref 3.5–5.1)
Sodium: 136 mmol/L (ref 135–145)

## 2016-05-05 MED ORDER — CEPHALEXIN 250 MG PO CAPS
500.0000 mg | ORAL_CAPSULE | Freq: Once | ORAL | Status: AC
  Administered 2016-05-05: 500 mg via ORAL
  Filled 2016-05-05: qty 2

## 2016-05-05 MED ORDER — CEPHALEXIN 500 MG PO CAPS: 500.0000 mg | ORAL_CAPSULE | Freq: Four times a day (QID) | ORAL | Status: AC

## 2016-05-05 NOTE — Discharge Instructions (Signed)

## 2016-05-05 NOTE — ED Notes (Signed)
Pt c/o R foot swelling and pain which started this morning. Pt denies injury. Pt with +1 edema noted to R foot. Pt denies SOB.

## 2016-05-05 NOTE — ED Notes (Signed)
Pt c/o bil feet swelling x 1 day

## 2016-05-05 NOTE — ED Provider Notes (Signed)
CSN: [id number]     Arrival date & time 05/05/16  1929 History  By signing my name below, I, Marisue Humble, attest that this documentation has been prepared under the direction and in the presence of Tilden Fossa, MD . Electronically Signed: Marisue Humble, Scribe. 05/05/2016. 7:54 PM.   Chief Complaint  Patient presents with  . Leg Swelling   The history is provided by the patient. No language interpreter was used.   HPI Comments:  Virginia Patterson is a 81 y.o. female with PMHx of DM, HTN and arthritis who presents to the Emergency Department complaining of BL foot swelling onset last night, worse on right foot. Pt reports pain in top and side of right foot. No alleviating or exacerbating factors noted. She notes similar pain with past infection of foot without edema. Denies trauma, fever, shortness of breath, h/o blood clots or use of blood thinners.  Past Medical History  Diagnosis Date  . Diabetes mellitus   . Hypertension   . Arthritis    Past Surgical History  Procedure Laterality Date  . Cholecystectomy    . Colon surgery     History reviewed. No pertinent family history. Social History  Substance Use Topics  . Smoking status: Never Smoker   . Smokeless tobacco: None  . Alcohol Use: No   OB History    No data available     Review of Systems  Constitutional: Negative for fever.  Respiratory: Negative for shortness of breath.   Cardiovascular: Positive for leg swelling.  Musculoskeletal: Positive for joint swelling and arthralgias.  All other systems reviewed and are negative.  Allergies  Ciprofloxacin and Vicodin  Home Medications   Prior to Admission medications   Medication Sig Start Date End Date Taking? Authorizing Provider  albuterol (ACCUNEB) 0.63 MG/3ML nebulizer solution Take 1 ampule by nebulization every 6 (six) hours as needed.      Historical Provider, MD  arformoterol (BROVANA) 15 MCG/2ML NEBU Take 15 mcg by nebulization 2 (two) times daily.       Historical Provider, MD  aspirin 325 MG EC tablet Take 325 mg by mouth daily.      Historical Provider, MD  atorvastatin (LIPITOR) 40 MG tablet Take 40 mg by mouth daily.      Historical Provider, MD  bumetanide (BUMEX) 0.5 MG tablet Take 0.5 mg by mouth daily.      Historical Provider, MD  cephALEXin (KEFLEX) 500 MG capsule Take 1 capsule (500 mg total) by mouth 4 (four) times daily. 05/05/16   Tilden Fossa, MD  diclofenac-misoprostol (ARTHROTEC 75) 75-0.2 MG per tablet Take 1 tablet by mouth daily.      Historical Provider, MD  gabapentin (NEURONTIN) 300 MG capsule Take 300 mg by mouth 3 (three) times daily.      Historical Provider, MD  ibuprofen (ADVIL,MOTRIN) 200 MG tablet Take 1 tablet (200 mg total) by mouth every 6 (six) hours as needed. 07/20/14   Linwood Dibbles, MD  insulin aspart protamine-insulin aspart (NOVOLOG 70/30) (70-30) 100 UNIT/ML injection Inject into the skin.      Historical Provider, MD  Insulin Pen Needle (NOVOFINE) 30G X 8 MM MISC Inject 1 packet into the skin as needed.      Historical Provider, MD  metFORMIN (GLUCOPHAGE) 500 MG tablet Take 500 mg by mouth daily after supper.      Historical Provider, MD  nitroGLYCERIN (NITROSTAT) 0.4 MG SL tablet Place 0.4 mg under the tongue every 5 (five) minutes as needed.  Historical Provider, MD  nortriptyline (PAMELOR) 25 MG capsule Take 25 mg by mouth at bedtime.      Historical Provider, MD  omeprazole (PRILOSEC) 20 MG capsule Take 20 mg by mouth daily.      Historical Provider, MD  oxyCODONE-acetaminophen (PERCOCET/ROXICET) 5-325 MG per tablet Take 1 tablet by mouth every 4 (four) hours as needed for severe pain. 08/16/14   Elpidio Anis, PA-C  simvastatin (ZOCOR) 40 MG tablet Take 40 mg by mouth daily.    Historical Provider, MD  valsartan-hydrochlorothiazide (DIOVAN-HCT) 160-25 MG per tablet Take 1 tablet by mouth daily.      Historical Provider, MD   BP 132/61 mmHg  Pulse 103  Temp(Src) 98.4 F (36.9 C) (Oral)  Resp 18  Ht   (1.626 m)  Wt 310 lb (140.615 kg)  BMI 53.19 kg/m2  SpO2 98% Physical Exam  Constitutional: She is oriented to person, place, and time. She appears well-developed and well-nourished.  HENT:  Head: Normocephalic and atraumatic.  Cardiovascular: Normal rate and regular rhythm.   No murmur heard. Pulses:      Dorsalis pedis pulses are 2+ on the right side, and 2+ on the left side.  Pulmonary/Chest: Effort normal and breath sounds normal. No respiratory distress.  Abdominal: Soft. There is no tenderness. There is no rebound and no guarding.  Musculoskeletal:  Mild edema in BL LE, right greater than left; mild erythema to right lateral foot with mild TTP  Neurological: She is alert and oriented to person, place, and time.  Skin: Skin is warm and dry.  Psychiatric: She has a normal mood and affect. Her behavior is normal.  Nursing note and vitals reviewed.  ED Course  Procedures  DIAGNOSTIC STUDIES:  Oxygen Saturation is 97% on RA, normal by my interpretation.    COORDINATION OF CARE:  7:50 PM Will order BMP, CBC and x-ray of right foot. Discussed treatment plan with pt at bedside and pt agreed to plan.  Labs Review Labs Reviewed  BASIC METABOLIC PANEL - Abnormal; Notable for the following:    Glucose, Bld 176 (*)    Creatinine, Ser 1.35 (*)    Calcium 8.5 (*)    GFR calc non Af Amer 37 (*)    GFR calc Af Amer 42 (*)    All other components within normal limits  CBC WITH DIFFERENTIAL/PLATELET - Abnormal; Notable for the following:    Hemoglobin 11.6 (*)    All other components within normal limits    Imaging Review Dg Foot Complete Right  05/05/2016  CLINICAL DATA:  Nontraumatic foot swelling, onset last night. EXAM: RIGHT FOOT COMPLETE - 3+ VIEW COMPARISON:  None. FINDINGS: Negative for acute fracture, dislocation or radiopaque foreign body. No bone lesion or bony destruction. Mild midfoot degenerative changes are present. No soft tissue gas. IMPRESSION: Negative.  Electronically Signed   By: Ellery Plunk M.D.   On: 05/05/2016 21:35   I have personally reviewed and evaluated these images and lab results as part of my medical decision-making.   EKG Interpretation None      MDM   Final diagnoses:  Cellulitis of right lower extremity   Patient here for evaluation of right foot swelling and pain. Concern for developing cellulitis. Presentation is not consistent with septic arthritis,  gouty arthritis, DVT. Discussed home care: Outpatient follow-up, return questions. I personally performed the services described in this documentation, which was scribed in my presence. The recorded information has been reviewed and is accurate.  Tilden FossaElizabeth Budd Freiermuth, MD 05/05/16 2358

## 2016-07-03 IMAGING — DX DG FOOT COMPLETE 3+V*R*
3 series · 3 of 3 positions shown · non-contrast
Comparison: None.

CLINICAL DATA: Nontraumatic foot swelling, onset last night.

EXAM:
RIGHT FOOT COMPLETE - 3+ VIEW

[foot ap]
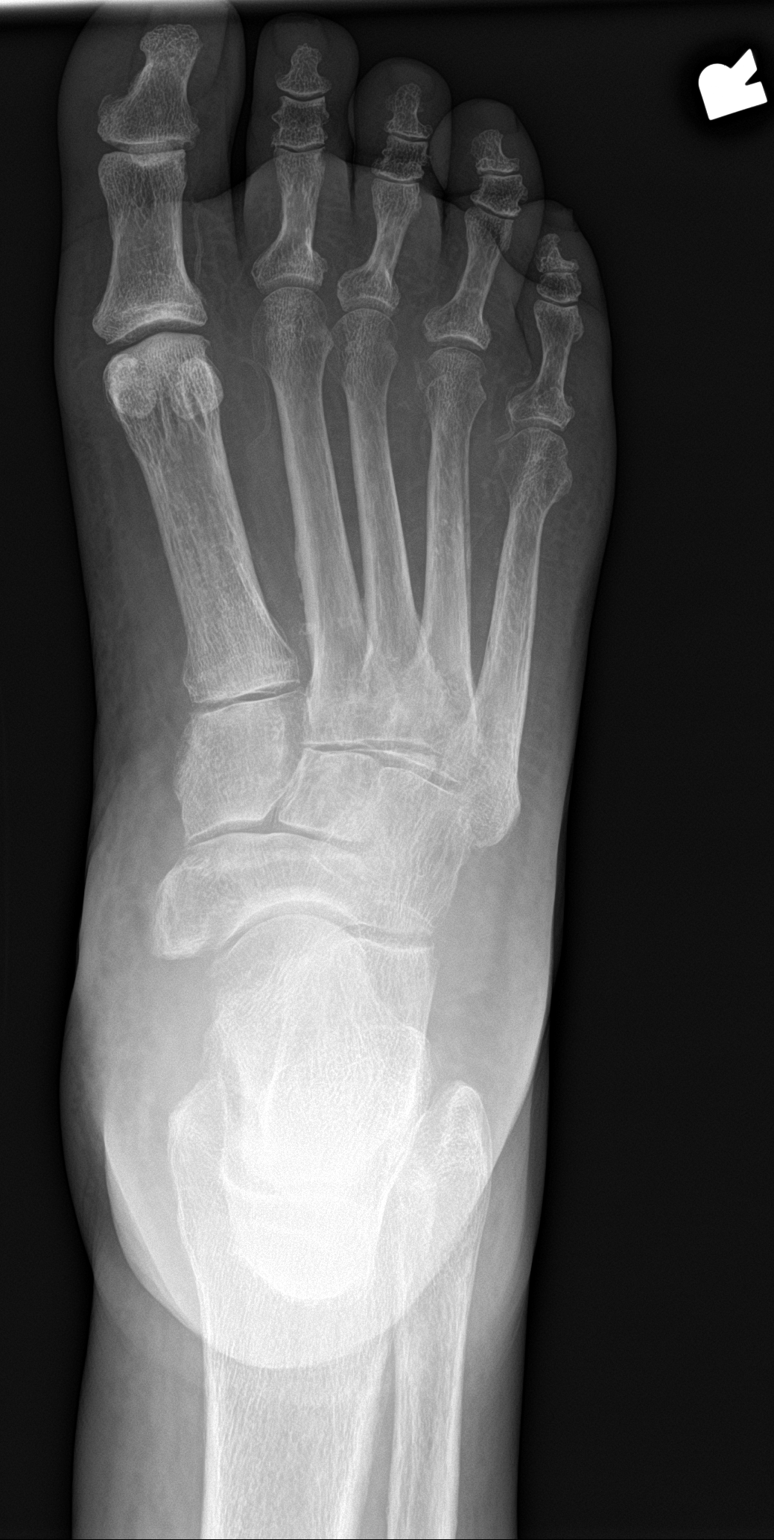

[foot obl]
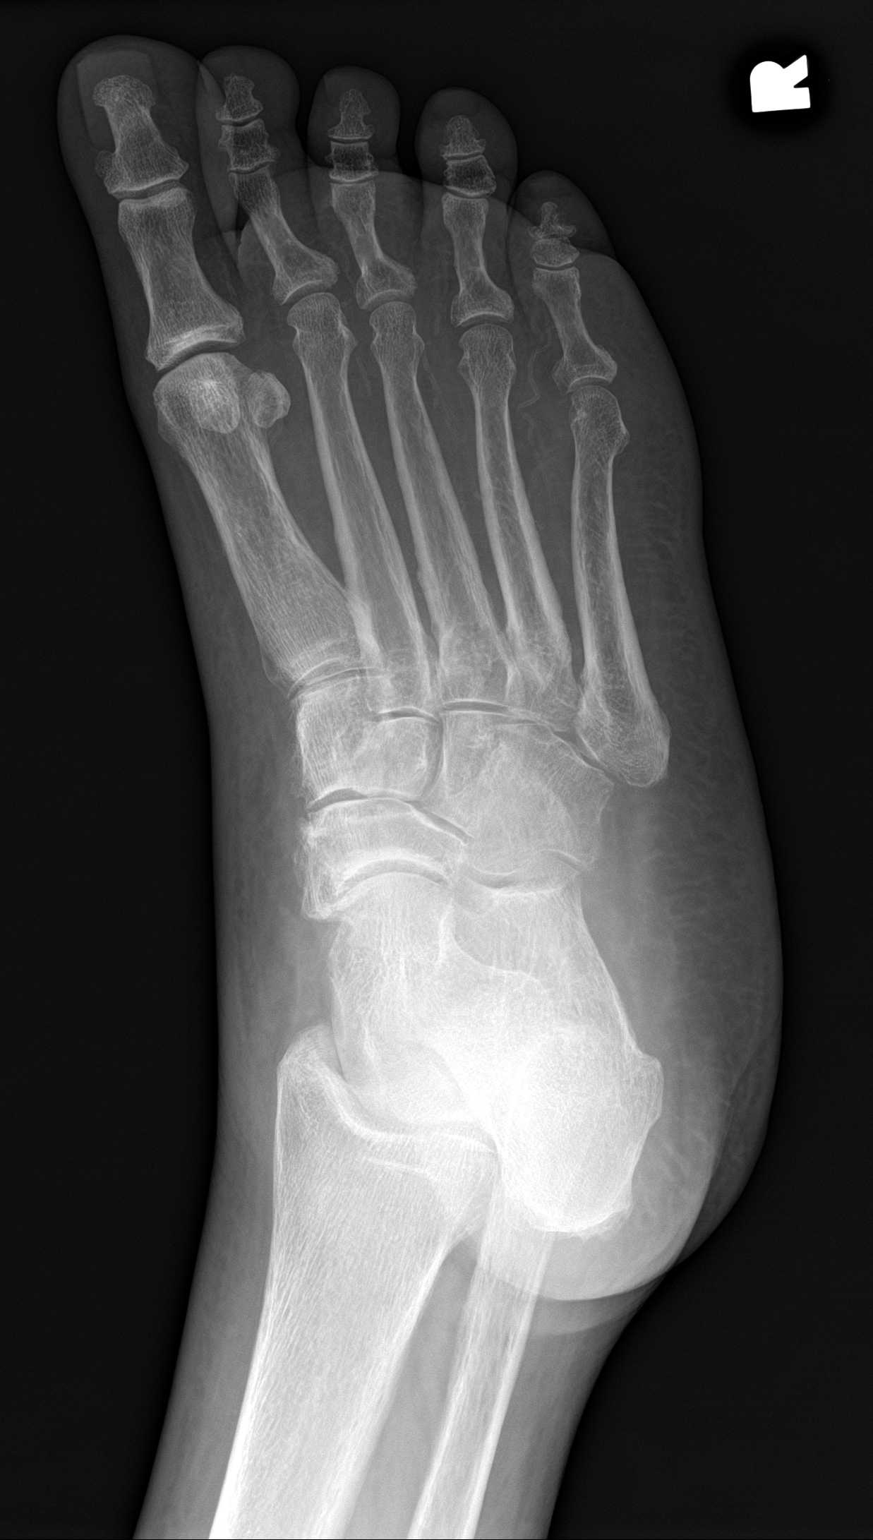

[foot lat]
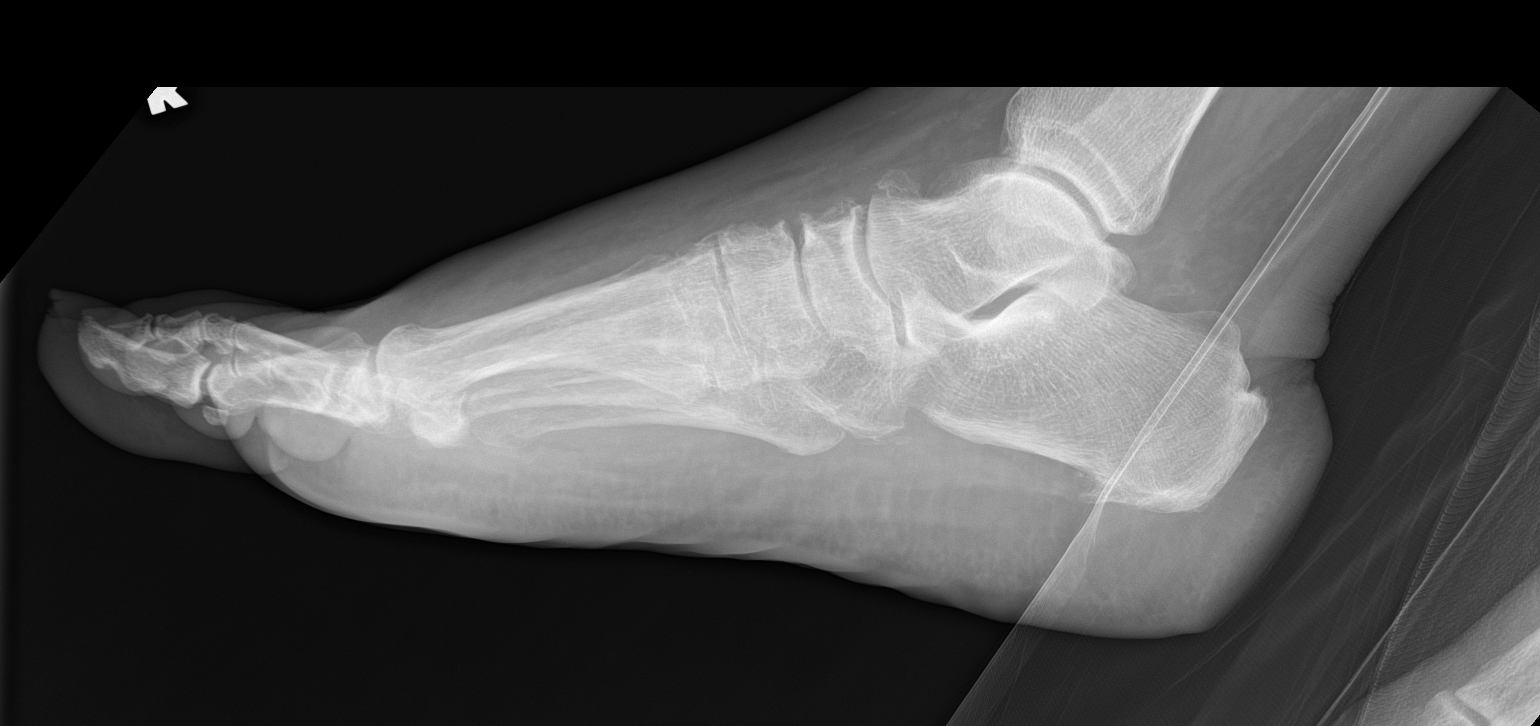

[3 of 3 positions shown; findings below may reference images not displayed]

FINDINGS: Negative for acute fracture, dislocation or radiopaque foreign body.
No bone lesion or bony destruction. Mild midfoot degenerative
changes are present. No soft tissue gas.
IMPRESSION: Negative.

## 2018-08-27 ENCOUNTER — Encounter (HOSPITAL_BASED_OUTPATIENT_CLINIC_OR_DEPARTMENT_OTHER): Payer: Self-pay | Admitting: *Deleted

## 2018-08-27 ENCOUNTER — Other Ambulatory Visit: Payer: Self-pay

## 2018-08-27 ENCOUNTER — Emergency Department (HOSPITAL_BASED_OUTPATIENT_CLINIC_OR_DEPARTMENT_OTHER)
Admission: EM | Admit: 2018-08-27 | Discharge: 2018-08-27 | Disposition: A | Discharge status: Home / Self Care | Payer: Medicare Other | Attending: Emergency Medicine | Admitting: Emergency Medicine

## 2018-08-27 DIAGNOSIS — E119 Type 2 diabetes mellitus without complications: Secondary | ICD-10-CM | POA: Insufficient documentation

## 2018-08-27 DIAGNOSIS — I10 Essential (primary) hypertension: Secondary | ICD-10-CM | POA: Diagnosis not present

## 2018-08-27 DIAGNOSIS — Z7982 Long term (current) use of aspirin: Secondary | ICD-10-CM | POA: Diagnosis not present

## 2018-08-27 DIAGNOSIS — M10032 Idiopathic gout, left wrist: Secondary | ICD-10-CM | POA: Diagnosis not present

## 2018-08-27 DIAGNOSIS — Z794 Long term (current) use of insulin: Secondary | ICD-10-CM | POA: Diagnosis not present

## 2018-08-27 DIAGNOSIS — M25532 Pain in left wrist: Secondary | ICD-10-CM | POA: Diagnosis present

## 2018-08-27 DIAGNOSIS — Z79899 Other long term (current) drug therapy: Secondary | ICD-10-CM | POA: Diagnosis not present

## 2018-08-27 DIAGNOSIS — M10332 Gout due to renal impairment, left wrist: Secondary | ICD-10-CM

## 2018-08-27 MED ORDER — COLCHICINE 0.6 MG PO TABS
1.2000 mg | ORAL_TABLET | Freq: Once | ORAL | Status: AC
  Administered 2018-08-27: 1.2 mg via ORAL
  Filled 2018-08-27: qty 2

## 2018-08-27 MED ORDER — OXYCODONE HCL 5 MG PO TABS
5.0000 mg | ORAL_TABLET | Freq: Once | ORAL | Status: AC
  Administered 2018-08-27: 5 mg via ORAL
  Filled 2018-08-27: qty 1

## 2018-08-27 MED ORDER — KETOROLAC TROMETHAMINE 15 MG/ML IJ SOLN
15.0000 mg | Freq: Once | INTRAMUSCULAR | Status: AC
  Administered 2018-08-27: 15 mg via INTRAMUSCULAR
  Filled 2018-08-27: qty 1

## 2018-08-27 MED ORDER — MORPHINE SULFATE 15 MG PO TABS: 15.0000 mg | ORAL_TABLET | ORAL | 0 refills | Status: AC | PRN

## 2018-08-27 MED ORDER — ACETAMINOPHEN 500 MG PO TABS
1000.0000 mg | ORAL_TABLET | Freq: Once | ORAL | Status: AC
  Administered 2018-08-27: 1000 mg via ORAL
  Filled 2018-08-27: qty 2

## 2018-08-27 MED ORDER — COLCHICINE 0.6 MG PO TABS: ORAL_TABLET | ORAL | 0 refills | Status: AC

## 2018-08-27 NOTE — ED Provider Notes (Signed)
[facility] EMERGENCY DEPARTMENT Provider Note   CSN: [id number] Arrival date & time: 08/27/18  1120     History   Chief Complaint Chief Complaint  Patient presents with  . Gout    HPI Virginia Patterson is a 83 y.o. female.  83 yo F with a cc of L wrist pain.  Going on for the past 3 days.  Feels like prior gout, denies trauma, denies fever.  Has had gout to that wrist before.   The history is provided by the patient and a relative.  Wrist Pain  This is a new problem. The current episode started 2 days ago. The problem occurs constantly. The problem has not changed since onset.Pertinent negatives include no chest pain, no abdominal pain, no headaches and no shortness of breath. The symptoms are aggravated by bending and twisting. Nothing relieves the symptoms. She has tried nothing for the symptoms. The treatment provided no relief.    Past Medical History:  Diagnosis Date  . Arthritis   . Diabetes mellitus   . Hypertension     There are no active problems to display for this patient.   Past Surgical History:  Procedure Laterality Date  . CHOLECYSTECTOMY    . COLON SURGERY       OB History   None      Home Medications    Prior to Admission medications   Medication Sig Start Date End Date Taking? Authorizing Provider  albuterol (ACCUNEB) 0.63 MG/3ML nebulizer solution Take 1 ampule by nebulization every 6 (six) hours as needed.      [provider]  arformoterol (BROVANA) 15 MCG/2ML NEBU Take 15 mcg by nebulization 2 (two) times daily.      [provider]  aspirin 325 MG EC tablet Take 325 mg by mouth daily.      [provider]  atorvastatin (LIPITOR) 40 MG tablet Take 40 mg by mouth daily.      [provider]  bumetanide (BUMEX) 0.5 MG tablet Take 0.5 mg by mouth daily.      [provider]  cephALEXin (KEFLEX) 500 MG capsule Take 1 capsule (500 mg total) by mouth 4 (four) times daily. 05/05/16   Tilden Fossa, MD  colchicine 0.6 MG tablet Take this an hour later after your first dose 08/27/18   Melene Plan, DO  diclofenac-misoprostol (ARTHROTEC 75) 75-0.2 MG per tablet Take 1 tablet by mouth daily.      [provider]  gabapentin (NEURONTIN) 300 MG capsule Take 300 mg by mouth 3 (three) times daily.      [provider]  ibuprofen (ADVIL,MOTRIN) 200 MG tablet Take 1 tablet (200 mg total) by mouth every 6 (six) hours as needed. 07/20/14   Linwood Dibbles, MD  insulin aspart protamine-insulin aspart (NOVOLOG 70/30) (70-30) 100 UNIT/ML injection Inject into the skin.      [provider]  Insulin Pen Needle (NOVOFINE) 30G X 8 MM MISC Inject 1 packet into the skin as needed.      [provider]  metFORMIN (GLUCOPHAGE) 500 MG tablet Take 500 mg by mouth daily after supper.      [provider]  morphine (MSIR) 15 MG tablet Take 1 tablet (15 mg total) by mouth every 4 (four) hours as needed for severe pain. 08/27/18   Melene Plan, DO  nitroGLYCERIN (NITROSTAT) 0.4 MG SL tablet Place 0.4 mg under the tongue every 5 (five) minutes as needed.      [provider]  nortriptyline (PAMELOR) 25 MG capsule Take 25 mg by mouth at bedtime.      [provider]  omeprazole (PRILOSEC) 20 MG capsule Take 20 mg by mouth daily.      [provider]  oxyCODONE-acetaminophen (PERCOCET/ROXICET) 5-325 MG per tablet Take 1 tablet by mouth every 4 (four) hours as needed for severe pain. 08/16/14   Elpidio Anis, PA-C  simvastatin (ZOCOR) 40 MG tablet Take 40 mg by mouth daily.    [provider]  valsartan-hydrochlorothiazide (DIOVAN-HCT) 160-25 MG per tablet Take 1 tablet by mouth daily.      [provider]    Family History History reviewed. No pertinent family history.  Social History Social History   Tobacco Use  . Smoking status: Never Smoker  Substance Use Topics  . Alcohol use: No  . Drug use: No     Allergies     Ciprofloxacin and Vicodin [hydrocodone-acetaminophen]   Review of Systems Review of Systems  Constitutional: Negative for chills and fever.  HENT: Negative for congestion and rhinorrhea.   Eyes: Negative for redness and visual disturbance.  Respiratory: Negative for shortness of breath and wheezing.   Cardiovascular: Negative for chest pain and palpitations.  Gastrointestinal: Negative for abdominal pain, nausea and vomiting.  Genitourinary: Negative for dysuria and urgency.  Musculoskeletal: Positive for arthralgias. Negative for myalgias.  Skin: Negative for pallor and wound.  Neurological: Negative for dizziness and headaches.     Physical Exam Updated Vital Signs BP 122/65 (BP Location: Right Arm)   Pulse 96   Temp 98.8 F (37.1 C) (Oral)   Resp 18   Ht 5\' 4"  (1.626 m)   Wt 90.7 kg   SpO2 100%   BMI 34.33 kg/m   Physical Exam  Constitutional: She is oriented to person, place, and time. She appears well-developed and well-nourished. No distress.  HENT:  Head: Normocephalic and atraumatic.  Eyes: Pupils are equal, round, and reactive to light. EOM are normal.  Neck: Normal range of motion. Neck supple.  Cardiovascular: Normal rate and regular rhythm. Exam reveals no gallop and no friction rub.  No murmur heard. Pulmonary/Chest: Effort normal. She has no wheezes. She has no rales.  Abdominal: Soft. She exhibits no distension. There is no tenderness.  Musculoskeletal: She exhibits edema and tenderness.  Tenderness and pain to the left wrist.  Marked edema.  Mildly warm, mild erythema.    Neurological: She is alert and oriented to person, place, and time.  Skin: Skin is warm and dry. She is not diaphoretic.  Psychiatric: She has a normal mood and affect. Her behavior is normal.  Nursing note and vitals reviewed.    ED Treatments / Results  Labs (all labs ordered are listed, but only abnormal results are displayed) Labs Reviewed - No data to  display  EKG None  Radiology No results found.  Procedures Procedures (including critical care time)  Medications Ordered in ED Medications  ketorolac (TORADOL) 15 MG/ML injection 15 mg (15 mg Intramuscular Given 08/27/18 1200)  acetaminophen (TYLENOL) tablet 1,000 mg (1,000 mg Oral Given 08/27/18 1200)  oxyCODONE (Oxy IR/ROXICODONE) immediate release tablet 5 mg (5 mg Oral Given 08/27/18 1200)  colchicine tablet 1.2 mg (1.2 mg Oral Given 08/27/18 1200)     Initial Impression / Assessment and Plan / ED Course  I have reviewed the triage vital signs and the nursing notes.  Pertinent labs & imaging results that were available during my care of the patient were  reviewed by me and considered in my medical decision making (see chart for details).     83 yo F with a cc of left wrist pain.  Going on for the past three days.  Hx of gout, feels the same. Will treat as gout.  PCP follow up.   12:02 PM:  I have discussed the diagnosis/risks/treatment options with the patient and family and believe the pt to be eligible for discharge home to follow-up with PCP. We also discussed returning to the ED immediately if new or worsening sx occur. We discussed the sx which are most concerning (e.g., sudden worsening pain, fever, inability to tolerate by mouth) that necessitate immediate return. Medications administered to the patient during their visit and any new prescriptions provided to the patient are listed below.  Medications given during this visit Medications  ketorolac (TORADOL) 15 MG/ML injection 15 mg (15 mg Intramuscular Given 08/27/18 1200)  acetaminophen (TYLENOL) tablet 1,000 mg (1,000 mg Oral Given 08/27/18 1200)  oxyCODONE (Oxy IR/ROXICODONE) immediate release tablet 5 mg (5 mg Oral Given 08/27/18 1200)  colchicine tablet 1.2 mg (1.2 mg Oral Given 08/27/18 1200)      The patient appears reasonably screen and/or stabilized for discharge and I doubt any other medical condition or other [facility]  requiring further screening, evaluation, or treatment in the ED at this time prior to discharge.    Final Clinical Impressions(s) / ED Diagnoses   Final diagnoses:  Acute gout due to renal impairment involving left wrist    ED Discharge Orders         Ordered    colchicine 0.6 MG tablet     08/27/18 1155    morphine (MSIR) 15 MG tablet  Every 4 hours PRN     08/27/18 1155           Melene Plan, DO 08/27/18 1202

## 2018-08-27 NOTE — Discharge Instructions (Signed)
Take 3 over the counter ibuprofen tablets 3 times a day or 2 over-the-counter naproxen tablets twice a day for pain. °Also take tylenol 1000mg(2 extra strength) four times a day.  ° °Then take the pain medicine if you feel like you need it. Narcotics do not help with the pain, they only make you care about it less.  You can become addicted to this, people may break into your house to steal it.  It will constipate you.  If you drive under the influence of this medicine you can get a DUI.   ° °

## 2018-08-27 NOTE — ED Triage Notes (Signed)
Gout to left wrist x 3-4 days. Hx of same. Denies injury

## 2019-07-27 DEATH — deceased
